# Patient Record
Sex: Male | Born: 1960 | Race: White | Hispanic: No | Marital: Married | Smoking: Never smoker
Health system: Southern US, Community
[De-identification: ages and names within clinical notes are randomized; demographics above are authoritative.]

## PROBLEM LIST (undated history)

## (undated) DIAGNOSIS — E079 Disorder of thyroid, unspecified: Secondary | ICD-10-CM

## (undated) DIAGNOSIS — I10 Essential (primary) hypertension: Secondary | ICD-10-CM

## (undated) HISTORY — PX: HERNIA REPAIR: SHX51

## (undated) HISTORY — PX: APPENDECTOMY: SHX54

---

## 2019-05-01 ENCOUNTER — Other Ambulatory Visit: Payer: Self-pay

## 2019-05-01 ENCOUNTER — Encounter (HOSPITAL_BASED_OUTPATIENT_CLINIC_OR_DEPARTMENT_OTHER): Payer: Self-pay | Admitting: *Deleted

## 2019-05-01 ENCOUNTER — Emergency Department (HOSPITAL_BASED_OUTPATIENT_CLINIC_OR_DEPARTMENT_OTHER)
Admission: EM | Admit: 2019-05-01 | Discharge: 2019-05-02 | Disposition: A | Discharge status: Home / Self Care | Payer: BC Managed Care – PPO | Attending: Emergency Medicine | Admitting: Emergency Medicine

## 2019-05-01 ENCOUNTER — Emergency Department (HOSPITAL_BASED_OUTPATIENT_CLINIC_OR_DEPARTMENT_OTHER): Payer: BC Managed Care – PPO

## 2019-05-01 DIAGNOSIS — S6992XA Unspecified injury of left wrist, hand and finger(s), initial encounter: Secondary | ICD-10-CM | POA: Diagnosis present

## 2019-05-01 DIAGNOSIS — Y939 Activity, unspecified: Secondary | ICD-10-CM | POA: Insufficient documentation

## 2019-05-01 DIAGNOSIS — I10 Essential (primary) hypertension: Secondary | ICD-10-CM | POA: Insufficient documentation

## 2019-05-01 DIAGNOSIS — Y929 Unspecified place or not applicable: Secondary | ICD-10-CM | POA: Diagnosis not present

## 2019-05-01 DIAGNOSIS — W231XXA Caught, crushed, jammed, or pinched between stationary objects, initial encounter: Secondary | ICD-10-CM | POA: Diagnosis not present

## 2019-05-01 DIAGNOSIS — S61217A Laceration without foreign body of left little finger without damage to nail, initial encounter: Secondary | ICD-10-CM | POA: Diagnosis not present

## 2019-05-01 DIAGNOSIS — Y999 Unspecified external cause status: Secondary | ICD-10-CM | POA: Diagnosis not present

## 2019-05-01 HISTORY — DX: Essential (primary) hypertension: I10

## 2019-05-01 HISTORY — DX: Disorder of thyroid, unspecified: E07.9

## 2019-05-01 MED ORDER — BUPIVACAINE HCL 0.5 % IJ SOLN
50.0000 mL | Freq: Once | INTRAMUSCULAR | Status: AC
  Administered 2019-05-02: 50 mL
  Filled 2019-05-01: qty 1

## 2019-05-01 MED ORDER — CEPHALEXIN 250 MG PO CAPS
500.0000 mg | ORAL_CAPSULE | Freq: Once | ORAL | Status: AC
  Administered 2019-05-02: 500 mg via ORAL
  Filled 2019-05-01: qty 2

## 2019-05-01 MED ORDER — OXYCODONE-ACETAMINOPHEN 5-325 MG PO TABS
1.0000 | ORAL_TABLET | Freq: Once | ORAL | Status: AC
  Administered 2019-05-02: 1 via ORAL
  Filled 2019-05-01: qty 1

## 2019-05-01 NOTE — ED Provider Notes (Signed)
[facility] EMERGENCY DEPARTMENT Provider Note   CSN: [id number] Arrival date & time: 05/01/19  2046     History   Chief Complaint Chief Complaint  Patient presents with   Finger Injury    HPI Dalton Wheeler is a 58 y.o. male evaluation of left fifth digit pain and injury that happened tonight.  He reports that he was throwing something in a dumpster and states that the lid fell and hit his finger.  He reports that he got caught in a lid in the base, causing an injury.  He states that he is on a blood thinners.  He does not know when his last tetanus shot was.  He denies any/weakness.     The history is provided by the patient.    Past Medical History:  Diagnosis Date   Hypertension    Thyroid disease     There are no active problems to display for this patient.   Past Surgical History:  Procedure Laterality Date   APPENDECTOMY     HERNIA REPAIR          Home Medications    Prior to Admission medications   Medication Sig Start Date End Date Taking? Authorizing Provider  cephALEXin (KEFLEX) 500 MG capsule Take 1 capsule (500 mg total) by mouth 2 (two) times daily for 7 days. 05/02/19 05/09/19  Maxwell Caul, PA-C    Family History No family history on file.  Social History Social History   Tobacco Use   Smoking status: Never Smoker   Smokeless tobacco: Never Used  Substance Use Topics   Alcohol use: Not Currently   Drug use: Never     Allergies   Patient has no known allergies.   Review of Systems Review of Systems  Skin: Positive for wound.  Neurological: Negative for weakness and numbness.  All other systems reviewed and are negative.    Physical Exam Updated Vital Signs BP 138/72 (BP Location: Left Arm)    Pulse 78    Temp 98.6 F (37 C) (Oral)    Resp 18    Ht 5\' 10"  (1.778 m)    Wt 81.6 kg    SpO2 100%    BMI 25.83 kg/m   Physical Exam Vitals signs and nursing note reviewed.  Constitutional:    Appearance: He is well-developed.  HENT:     Head: Normocephalic and atraumatic.  Eyes:     General: No scleral icterus.       Right eye: No discharge.        Left eye: No discharge.     Conjunctiva/sclera: Conjunctivae normal.  Cardiovascular:     Pulses:          Radial pulses are 2+ on the right side and 2+ on the left side.  Pulmonary:     Effort: Pulmonary effort is normal.  Musculoskeletal:     Comments: Full range of motion of all 5 digits of left hand with any difficulty.  Full flexion/tension both DIP and PIP of the fifth digit intact any difficulty.  Skin:    General: Skin is warm and dry.     Comments: Jagged irregular approximately 2 cm - 2.5 cm laceration that begins at the pad of the finger and extends laterally to the side and right up to the nail.  The nail itself has no damage.  I do not see any signs of nailbed injury.  Nail is intact.  Slightly delayed cap refill noted to left  fifth digit but still present.  It is not dusky in appearance.  Additionally, he has two 0.5 cm lacerations noted to the tip of the left fifth digit.  Neurological:     Mental Status: He is alert.  Psychiatric:        Speech: Speech normal.        Behavior: Behavior normal.      ED Treatments / Results  Labs (all labs ordered are listed, but only abnormal results are displayed) Labs Reviewed - No data to display  EKG None  Radiology Dg Finger Little Left  Result Date: 05/01/2019 CLINICAL DATA:  Hip tip of pinky on dumpster EXAM: LEFT LITTLE FINGER 2+V COMPARISON:  None. FINDINGS: No definite fracture seen. There is a well corticated ossicle seen adjacent to the base of the distal phalanx. There is overlying skin irregularity and soft tissue swelling. The nail bed appears to be irregular. IMPRESSION: No definite acute osseous abnormality. Well corticated ossicle adjacent base of the distal phalanx, likely from fragmented osteophyte. Electronically Signed   By: Jonna ClarkBindu  Avutu M.D.   On:  05/01/2019 21:42    Procedures .Marland Kitchen.Laceration Repair  Date/Time: 05/02/2019 12:01 AM Performed by: Maxwell CaulLayden, Mervin Ramires A, PA-C Authorized by: Maxwell CaulLayden, Maecyn Panning A, PA-C   Consent:    Consent obtained:  Verbal   Consent given by:  Patient   Risks discussed:  Infection, need for additional repair, pain, poor cosmetic result and poor wound healing   Alternatives discussed:  No treatment and delayed treatment Universal protocol:    Procedure explained and questions answered to patient or proxy's satisfaction: yes     Relevant documents present and verified: yes     Test results available and properly labeled: yes     Imaging studies available: yes     Required blood products, implants, devices, and special equipment available: yes     Site/side marked: yes     Immediately prior to procedure, a time out was called: yes     Patient identity confirmed:  Verbally with patient Anesthesia (see MAR for exact dosages):    Anesthesia method:  Nerve block   Block needle gauge:  25 G   Block anesthetic:  Bupivacaine 0.5% w/o epi   Block injection procedure:  Anatomic landmarks identified, introduced needle, incremental injection and negative aspiration for blood   Block outcome:  Anesthesia achieved Laceration details:    Location:  Finger   Finger location:  L small finger   Length (cm):  2.5 Repair type:    Repair type:  Intermediate Pre-procedure details:    Preparation:  Patient was prepped and draped in usual sterile fashion Exploration:    Hemostasis achieved with:  Direct pressure   Wound exploration: wound explored through full range of motion     Wound extent: no foreign bodies/material noted   Treatment:    Area cleansed with:  Betadine   Amount of cleaning:  Extensive   Irrigation solution:  Sterile saline   Irrigation method:  Syringe   Visualized foreign bodies/material removed: no   Skin repair:    Repair method:  Sutures   Suture size:  5-0   Suture material:  Nylon    Suture technique:  Simple interrupted   Number of sutures:  8 Approximation:    Approximation:  Close Post-procedure details:    Dressing:  Antibiotic ointment   Patient tolerance of procedure:  Tolerated well, no immediate complications .Marland Kitchen.Laceration Repair  Date/Time: 05/02/2019 12:01 AM Performed by: Maxwell CaulLayden, Cherree Conerly A,  PA-C Authorized by: Maxwell Caul, PA-C   Consent:    Consent obtained:  Verbal   Consent given by:  Patient   Risks discussed:  Infection, need for additional repair, pain, poor cosmetic result and poor wound healing   Alternatives discussed:  No treatment and delayed treatment Universal protocol:    Procedure explained and questions answered to patient or proxy's satisfaction: yes     Relevant documents present and verified: yes     Test results available and properly labeled: yes     Imaging studies available: yes     Required blood products, implants, devices, and special equipment available: yes     Site/side marked: yes     Immediately prior to procedure, a time out was called: yes     Patient identity confirmed:  Verbally with patient Anesthesia (see MAR for exact dosages):    Anesthesia method:  Nerve block   Block needle gauge:  25 G   Block anesthetic:  Bupivacaine 0.5% w/o epi   Block injection procedure:  Anatomic landmarks identified, introduced needle, incremental injection and negative aspiration for blood Laceration details:    Location:  Finger   Finger location:  L small finger   Length (cm):  0.5 Repair type:    Repair type:  Intermediate Pre-procedure details:    Preparation:  Patient was prepped and draped in usual sterile fashion Exploration:    Hemostasis achieved with:  Direct pressure   Wound extent: no foreign bodies/material noted   Treatment:    Area cleansed with:  Betadine   Amount of cleaning:  Extensive   Irrigation solution:  Sterile saline   Irrigation method:  Syringe   Visualized foreign bodies/material removed:  no   Skin repair:    Repair method:  Sutures   Suture size:  5-0   Suture material:  Nylon   Suture technique:  Simple interrupted   Number of sutures:  2 Approximation:    Approximation:  Close Post-procedure details:    Dressing:  Antibiotic ointment   Patient tolerance of procedure:  Tolerated well, no immediate complications .Marland KitchenLaceration Repair  Date/Time: 05/02/2019 12:02 AM Performed by: Maxwell Caul, PA-C Authorized by: Maxwell Caul, PA-C   Consent:    Consent obtained:  Verbal   Consent given by:  Patient   Risks discussed:  Infection, need for additional repair, pain, poor cosmetic result and poor wound healing   Alternatives discussed:  No treatment and delayed treatment Universal protocol:    Procedure explained and questions answered to patient or proxy's satisfaction: yes     Relevant documents present and verified: yes     Test results available and properly labeled: yes     Imaging studies available: yes     Required blood products, implants, devices, and special equipment available: yes     Site/side marked: yes     Immediately prior to procedure, a time out was called: yes     Patient identity confirmed:  Verbally with patient Anesthesia (see MAR for exact dosages):    Anesthesia method:  Nerve block   Block needle gauge:  25 G   Block anesthetic:  Bupivacaine 0.5% WITH epi   Block injection procedure:  Anatomic landmarks identified, introduced needle, incremental injection and negative aspiration for blood   Block outcome:  Anesthesia achieved Laceration details:    Location:  Finger   Finger location:  L small finger   Length (cm):  0.5 Repair type:    Repair  type:  Intermediate Pre-procedure details:    Preparation:  Patient was prepped and draped in usual sterile fashion Exploration:    Hemostasis achieved with:  Direct pressure   Contaminated: no   Treatment:    Area cleansed with:  Betadine   Amount of cleaning:  Extensive    Irrigation solution:  Sterile saline   Visualized foreign bodies/material removed: no   Skin repair:    Repair method:  Sutures   Suture size:  5-0   Suture material:  Nylon   Suture technique:  Simple interrupted   Number of sutures:  2 Approximation:    Approximation:  Close Post-procedure details:    Dressing:  Antibiotic ointment   Patient tolerance of procedure:  Tolerated well, no immediate complications   (including critical care time)  Medications Ordered in ED Medications  bupivacaine (MARCAINE) 0.5 % (with pres) injection 50 mL (50 mLs Infiltration Given 05/02/19 0031)  cephALEXin (KEFLEX) capsule 500 mg (500 mg Oral Given 05/02/19 0029)  oxyCODONE-acetaminophen (PERCOCET/ROXICET) 5-325 MG per tablet 1 tablet (1 tablet Oral Given 05/02/19 0029)  bacitracin ointment ( Topical Given 05/02/19 0029)     Initial Impression / Assessment and Plan / ED Course  I have reviewed the triage vital signs and the nursing notes.  Pertinent labs & imaging results that were available during my care of the patient were reviewed by me and considered in my medical decision making (see chart for details).        59 year old male who presents for evaluation of left fifth digit laceration.  Reports that he was throwing something away and his finger got caught in a dumpster.  No blood thinners.  No numbness/weakness. Patient is afebrile, non-toxic appearing, sitting comfortably on examination table. Vital signs reviewed and stable. Patient is neurovascularly intact.  He has a 2.5 cm avulsion laceration that begins at the middle aspect of his finger and wraparound the lateral side just to the nailbed.  The nail itself is intact without any signs of damage.  He also has 2 small little laceration to the tip of the fingers.  We will plan for x-ray to evaluate for any acute bony abnormality.  Will update Tdap.  X-ray reviewed.  No acute bony abnormality.  Once anesthesia was completed, it was  thoroughly and extensively irrigated.  Full evaluation of the wound did show that it was a mostly avulsion injury.  He did have some slightly delayed cap refill noted to the tip.  I discussed treatment options with him.  I discussed given the nature of the injury, there is a chance that the avulsion tip may not get regained good's of blood supply and may die off.  Given the good distal cap refill, I discussed with him that I would prefer to put the area back in place and give it best chance for healing.  Patient is agreeable.  Laceration repaired as documented above.  Given that it occurred with a dumpster, will plan for antibiotics.  Patient instructed to follow-up with hand as directed given the avulsion type injury. At this time, patient exhibits no emergent life-threatening condition that require further evaluation in ED or admission. Patient had ample opportunity for questions and discussion. All patient's questions were answered with full understanding. Strict return precautions discussed. Patient expresses understanding and agreement to plan.   Portions of this note were generated with [employer]. Dictation errors may occur despite best attempts at proofreading.   Final Clinical Impressions(s) / ED Diagnoses  Final diagnoses:  Laceration of left little finger without foreign body without damage to nail, initial encounter    ED Discharge Orders         Ordered    cephALEXin (KEFLEX) 500 MG capsule  2 times daily     05/02/19 0008           Maxwell Caul, PA-C 05/02/19 0057    Rolan Bucco, MD 05/10/19 [phone]

## 2019-05-01 NOTE — ED Triage Notes (Signed)
Pt reports he tossed a board in a dumpster and it bounced back pinching his left pinky finger between the board and the dumpster. Avulsion lac noted to tip of pinky

## 2019-05-02 MED ORDER — CEPHALEXIN 500 MG PO CAPS: 500.0000 mg | ORAL_CAPSULE | Freq: Two times a day (BID) | ORAL | 0 refills | Status: AC

## 2019-05-02 MED ORDER — BACITRACIN ZINC 500 UNIT/GM EX OINT
TOPICAL_OINTMENT | Freq: Once | CUTANEOUS | Status: AC
  Administered 2019-05-02: via TOPICAL
  Filled 2019-05-02: qty 28.35

## 2019-05-02 NOTE — Discharge Instructions (Signed)
Keep the wound clean and dry for the first 24 hours. After that you may gently clean the wound with soap and water. Make sure to pat dry the wound before covering it with any dressing. You can use topical antibiotic ointment and bandage. Ice and elevate for pain relief.   You can take Tylenol or Ibuprofen as directed for pain. You can alternate Tylenol and Ibuprofen every 4 hours for additional pain relief.   Take antibiotics as directed. Please take all of your antibiotics until finished.  Follow-up with referred orthopedic doctor.  Call their office arrange for an appointment.  Return to the Emergency Department, your primary care doctor, or the [facility] [place] in 5-7 days for suture removal.   Monitor closely for any signs of infection. Return to the Emergency Department for any worsening redness/swelling of the area that begins to spread, drainage from the site, worsening pain, fever or any other worsening or concerning symptoms.

## 2021-02-20 IMAGING — DX DG FINGER LITTLE 2+V*L*
3 series · 3 of 3 positions shown · non-contrast
Comparison: None.

CLINICAL DATA: Hip tip of pinky on dumpster

EXAM:
LEFT LITTLE FINGER 2+V

[finger ap]
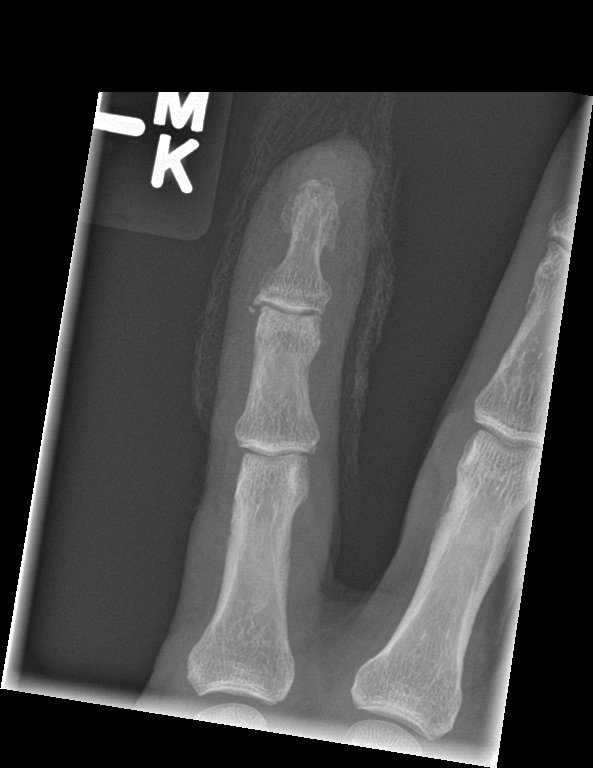

[finger obl]
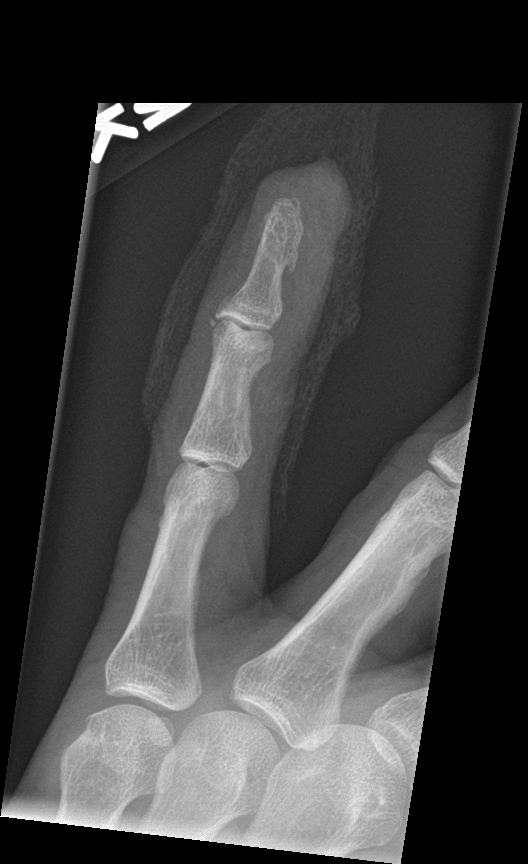

[finger lat]
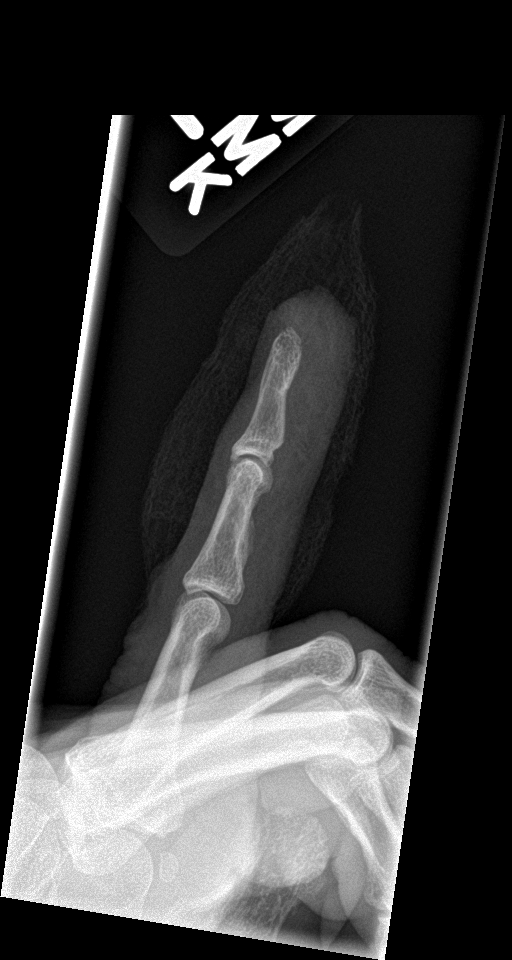

[3 of 3 positions shown; findings below may reference images not displayed]

FINDINGS: No definite fracture seen. There is a well corticated ossicle seen
adjacent to the base of the distal phalanx. There is overlying skin
irregularity and soft tissue swelling. The nail bed appears to be
irregular.
IMPRESSION: No definite acute osseous abnormality. Well corticated ossicle
adjacent base of the distal phalanx, likely from fragmented
osteophyte.
